# Patient Record
Sex: Male | Born: 1965 | Race: White | Hispanic: No | Marital: Single | Smoking: Current some day smoker
Health system: Southern US, Community
[De-identification: ages and names within clinical notes are randomized; demographics above are authoritative.]

## PROBLEM LIST (undated history)

## (undated) HISTORY — PX: SHOULDER SURGERY: SHX246

## (undated) HISTORY — PX: ELBOW SURGERY: SHX618

## (undated) HISTORY — PX: KNEE SURGERY: SHX244

---

## 2015-03-14 ENCOUNTER — Encounter: Payer: Self-pay | Admitting: Emergency Medicine

## 2015-03-14 ENCOUNTER — Emergency Department: Payer: BLUE CROSS/BLUE SHIELD

## 2015-03-14 ENCOUNTER — Emergency Department
Admission: EM | Admit: 2015-03-14 | Discharge: 2015-03-14 | Disposition: A | Discharge status: Home / Self Care | Payer: BLUE CROSS/BLUE SHIELD | Attending: Emergency Medicine | Admitting: Emergency Medicine

## 2015-03-14 DIAGNOSIS — G510 Bell's palsy: Secondary | ICD-10-CM

## 2015-03-14 DIAGNOSIS — R51 Headache: Secondary | ICD-10-CM | POA: Diagnosis present

## 2015-03-14 DIAGNOSIS — F17200 Nicotine dependence, unspecified, uncomplicated: Secondary | ICD-10-CM | POA: Insufficient documentation

## 2015-03-14 MED ORDER — PREDNISONE 10 MG (21) PO TBPK: 10.0000 mg | ORAL_TABLET | Freq: Every day | ORAL | Status: AC

## 2015-03-14 MED ORDER — PREDNISONE 20 MG PO TABS
60.0000 mg | ORAL_TABLET | Freq: Once | ORAL | Status: AC
  Administered 2015-03-14: 60 mg via ORAL
  Filled 2015-03-14: qty 3

## 2015-03-14 MED ORDER — VALACYCLOVIR HCL 1 G PO TABS: 2000.0000 mg | ORAL_TABLET | Freq: Three times a day (TID) | ORAL | Status: AC

## 2015-03-14 MED ORDER — VALACYCLOVIR HCL 500 MG PO TABS
1000.0000 mg | ORAL_TABLET | Freq: Once | ORAL | Status: AC
  Administered 2015-03-14: 1000 mg via ORAL
  Filled 2015-03-14: qty 2

## 2015-03-14 NOTE — ED Provider Notes (Signed)
[facility] Emergency Department Provider Note     Time seen: ----------------------------------------- 10:28 AM on 03/14/2015 -----------------------------------------    I have reviewed the triage vital signs and the nursing notes.   HISTORY  Chief Complaint Facial Droop    HPI Alfred Smith is a 50 y.o. male brought to the ER for numbness and pain to the left side of his face and neck with facial droop for the last 3 days. Patient states he hasn't been able to close his eye in 3 days. Patient denies any fevers, chills, chest pain, shortness of breath, nausea vomiting or diarrhea. She is also complaining of severe pain in the left ear canal. He has no medical problems.   History reviewed. No pertinent past medical history.  There are no active problems to display for this patient.   Past Surgical History  Procedure Laterality Date  . Knee surgery    . Shoulder surgery    . Elbow surgery      Allergies Sulfa antibiotics  Social History Social History  Substance Use Topics  . Smoking status: Current Some Day Smoker  . Smokeless tobacco: None  . Alcohol Use: Yes     Comment: occas.     Review of Systems Constitutional: Negative for fever. Eyes: Negative for visual changes. ENT: Negative for sore throat. Cardiovascular: Negative for chest pain. Respiratory: Negative for shortness of breath. Gastrointestinal: Negative for abdominal pain, vomiting and diarrhea. Genitourinary: Negative for dysuria. Musculoskeletal: Negative for back pain. Skin: Negative for rash. Neurological: Positive for left facial droop and decreased sensation  10-point ROS otherwise negative.  ____________________________________________   PHYSICAL EXAM:  VITAL SIGNS: ED Triage Vitals  Enc Vitals Group     BP 03/14/15 0954 127/65 mmHg     Pulse Rate 03/14/15 0950 68     Resp 03/14/15 0950 16     Temp 03/14/15 0950 98.2 F (36.8 C)     Temp  Source 03/14/15 0950 Oral     SpO2 03/14/15 0950 97 %     Weight 03/14/15 0950 170 lb (77.111 kg)     Height 03/14/15 0950  (1.727 m)     Head Cir --      Peak Flow --      Pain Score 03/14/15 0954 6     Pain Loc --      Pain Edu? --      Excl. in GC? --     Constitutional: Alert and oriented. Well appearing and in no distress. Eyes: Conjunctival injection the left eye. PERRL. Normal extraocular movements. ENT   Head: Normocephalic and atraumatic.   Nose: No congestion/rhinnorhea.   Mouth/Throat: Mucous membranes are moist.   Neck: No stridor. Cardiovascular: Normal rate, regular rhythm. Normal and symmetric distal pulses are present in all extremities. No murmurs, rubs, or gallops. Respiratory: Normal respiratory effort without tachypnea nor retractions. Breath sounds are clear and equal bilaterally. No wheezes/rales/rhonchi. Gastrointestinal: Soft and nontender. No distention. No abdominal bruits.  Musculoskeletal: Nontender with normal range of motion in all extremities. No joint effusions.  No lower extremity tenderness nor edema. Neurologic:  Normal speech and language. Left-sided facial paralysis, forehead is involved. Decreased sensation left side of the face, tongue treats midline. Skin:  Skin is warm, dry and intact. No rash noted. Psychiatric: Mood and affect are normal. Speech and behavior are normal. Patient exhibits appropriate insight and judgment. ____________________________________________  ED COURSE:  Pertinent labs & imaging results that were available during my care  of the patient were reviewed by me and considered in my medical decision making (see chart for details). Patient clinically with Bell's palsy, we'll obtain CT imaging to ensure diagnosis. ____________________________________________  RADIOLOGY  CT head is unremarkable  ____________________________________________  FINAL ASSESSMENT AND PLAN  Bell's palsy  Plan: Patient with  labs and imaging as dictated above. Patient will continue on prednisone and valacyclovir for the next week. He'll be referred to his primary care doctor follow-up.   Emily Filbert, MD   Emily Filbert, MD 03/14/15 [id number]

## 2015-03-14 NOTE — ED Notes (Signed)
MD at bedside. 

## 2015-03-14 NOTE — ED Notes (Signed)
Pt states numbness and pain on the left side of his face/neck for 3 days now. Left facial droop noted, states he hasn't been able to close his left eye in 3 days.  Grips equal, speech clear.

## 2015-03-14 NOTE — Discharge Instructions (Signed)
Bell Palsy °Bell palsy is a condition in which the muscles on one side of the face become paralyzed. This often causes one side of the face to droop. It is a common condition and most people recover completely. °RISK FACTORS °Risk factors for Bell palsy include: °· Pregnancy. °· Diabetes. °· An infection by a virus, such as infections that cause cold sores. °CAUSES  °Bell palsy is caused by damage to or inflammation of a nerve in your face. It is unclear why this happens, but an infection by a virus may lead to it. Most of the time the reason it happens is unknown. °SIGNS AND SYMPTOMS  °Symptoms can range from mild to severe and can take place over a number of hours. Symptoms may include: °· Being unable to: °¨ Raise one or both eyebrows. °¨ Close one or both eyes. °¨ Feel parts of your face (facial numbness). °· Drooping of the eyelid and corner of the mouth. °· Weakness in the face. °· Paralysis of half your face. °· Loss of taste. °· Sensitivity to loud noises. °· Difficulty chewing. °· Tearing up of the affected eye. °· Dryness in the affected eye. °· Drooling. °· Pain behind one ear. °DIAGNOSIS  °Diagnosis of Bell palsy may include: °· A medical history and physical exam. °· An MRI. °· A CT scan. °· Electromyography (EMG). This is a test that checks how your nerves are working. °TREATMENT  °Treatment may include antiviral medicine to help shorten the length of the condition. Sometimes treatment is not needed and the symptoms go away on their own. °HOME CARE INSTRUCTIONS  °· Take medicines only as directed by your health care provider. °· Do facial massages and exercises as directed by your health care provider. °· If your eye is affected: °¨ Use moisturizing eye drops to prevent drying of your eye as directed by your health care provider. °¨ Protect your eye as directed by your health care provider. °SEEK MEDICAL CARE IF: °· Your symptoms do not get better or get worse. °· You are drooling. °· Your eye is red,  irritated, or hurts. °SEEK IMMEDIATE MEDICAL CARE IF:  °· Another part of your body feels weak or numb. °· You have difficulty swallowing. °· You have a fever along with symptoms of Bell palsy. °· You develop neck pain. °MAKE SURE YOU:  °· Understand these instructions. °· Will watch your condition. °· Will get help right away if you are not doing well or get worse. °  °This information is not intended to replace advice given to you by your health care provider. Make sure you discuss any questions you have with your health care provider. °  °Document Released: 02/05/2005 Document Revised: 10/27/2014 Document Reviewed: 05/15/2013 °Elsevier Interactive Patient Education ©2016 [employer] ° °

## 2017-04-20 IMAGING — CT CT HEAD W/O CM
1 series · 16 of 30 positions shown, 20 images · non-contrast
Comparison: None.

CLINICAL DATA: Numbness and pain along the left side of face and
neck for 3 days. Left facial droop.

EXAM:
CT HEAD WITHOUT CONTRAST
TECHNIQUE: Contiguous axial images were obtained from the base of the skull
through the vertex without intravenous contrast.

[Series 2: soft tissue · axial · 0.44mm/px · z∈[-190,-56]mm · 16 of 31 slices shown, 20 images]
[im 2/31  brain]
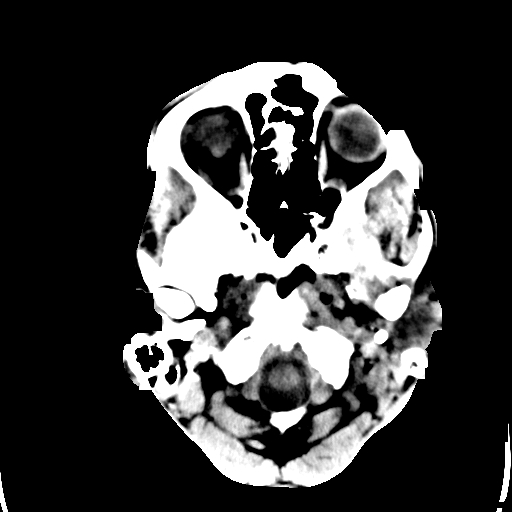
[im 2/31  bone]
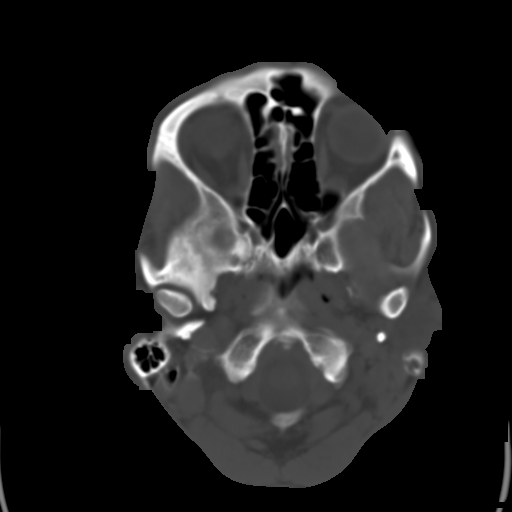
[im 4/31  brain]
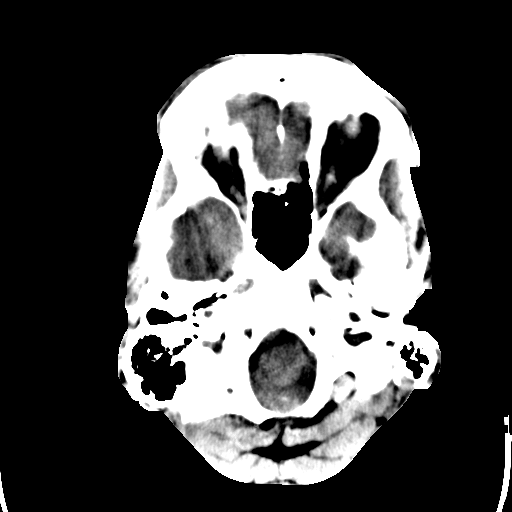
[im 6/31  brain]
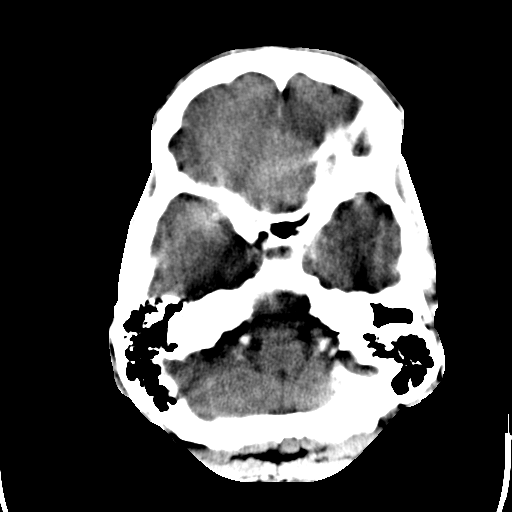
[im 8/31  brain]
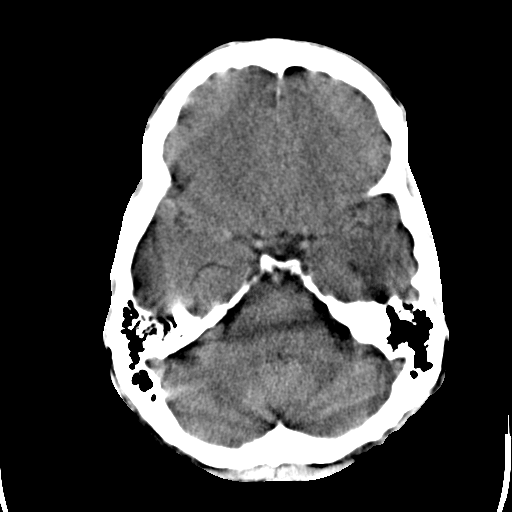
[im 9/31  brain]
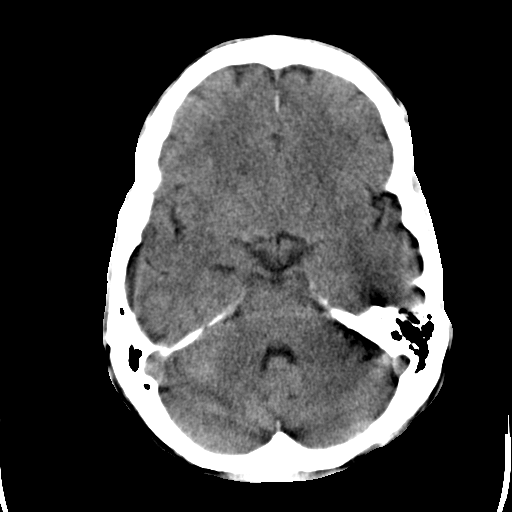
[im 9/31  bone]
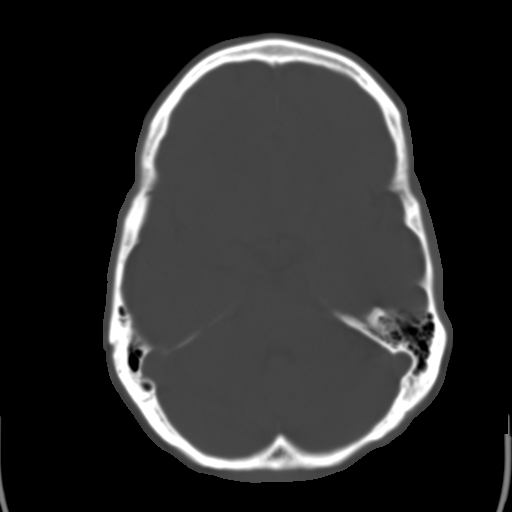
[im 11/31  brain]
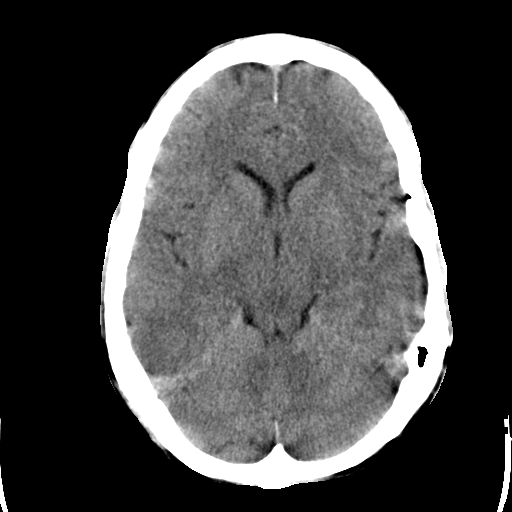
[im 13/31  brain]
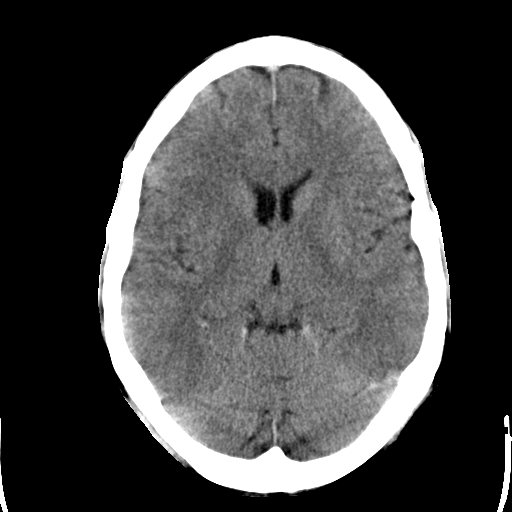
[im 15/31  brain]
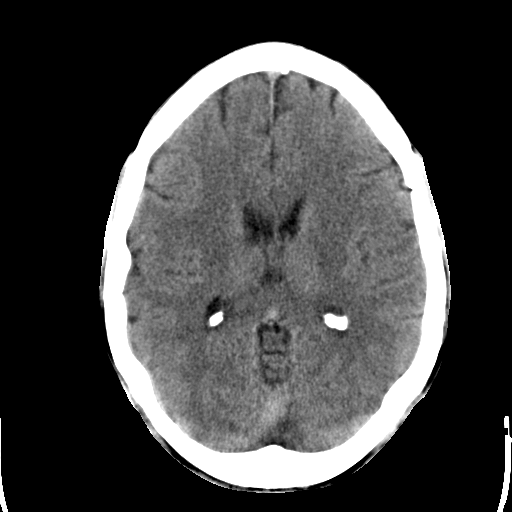
[im 16/31  brain]
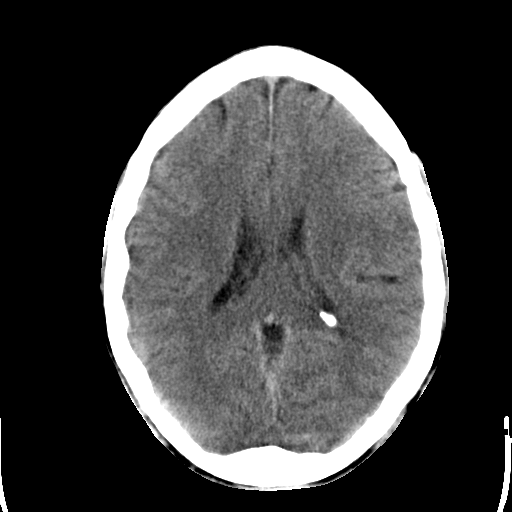
[im 16/31  bone]
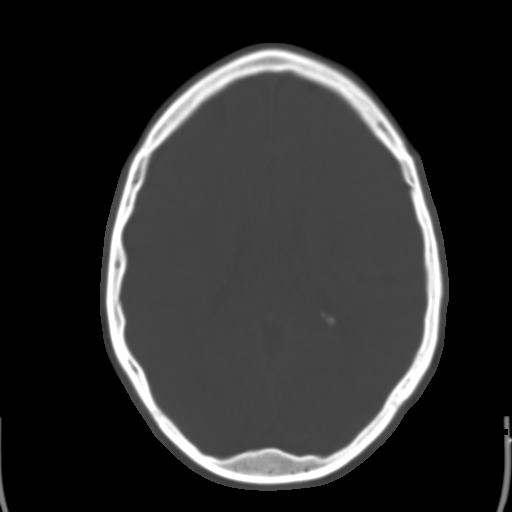
[im 18/31  brain]
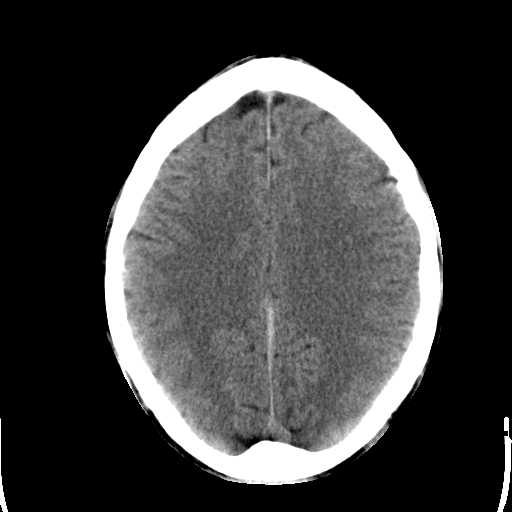
[im 20/31  brain]
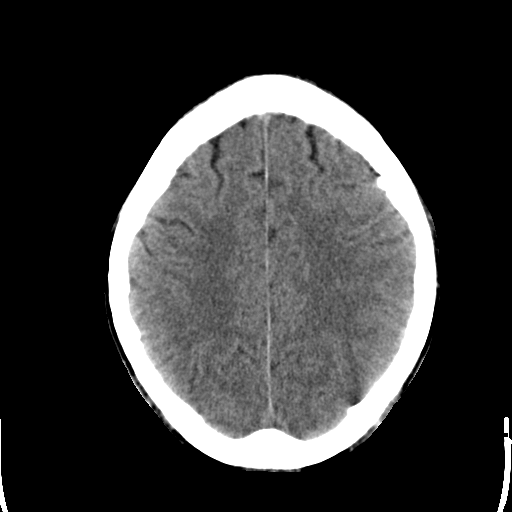
[im 22/31  brain]
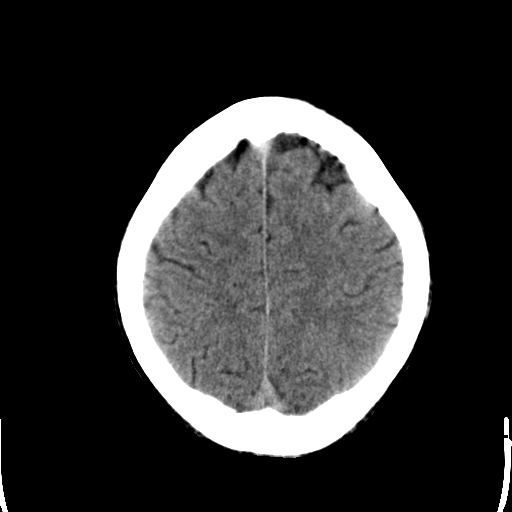
[im 23/31  brain]
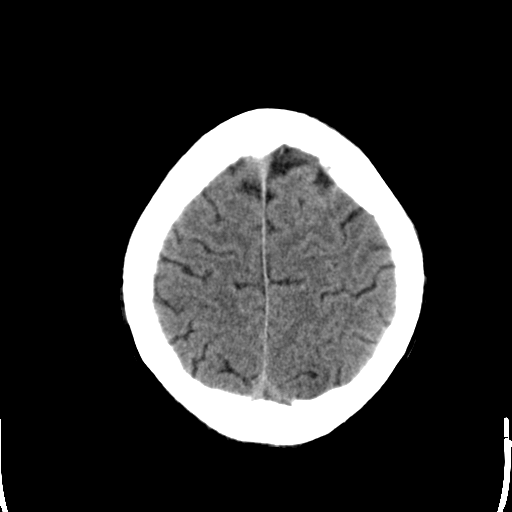
[im 23/31  bone]
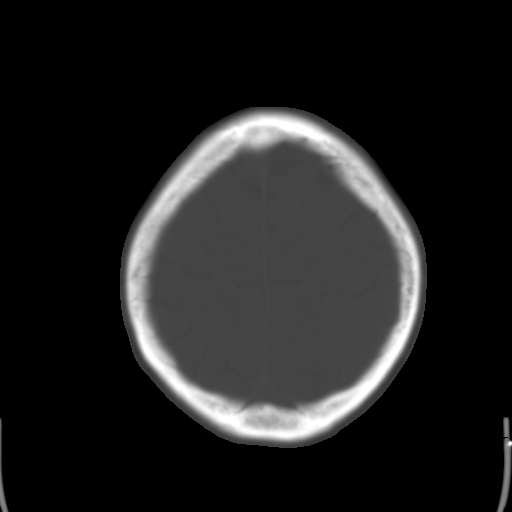
[im 25/31  brain]
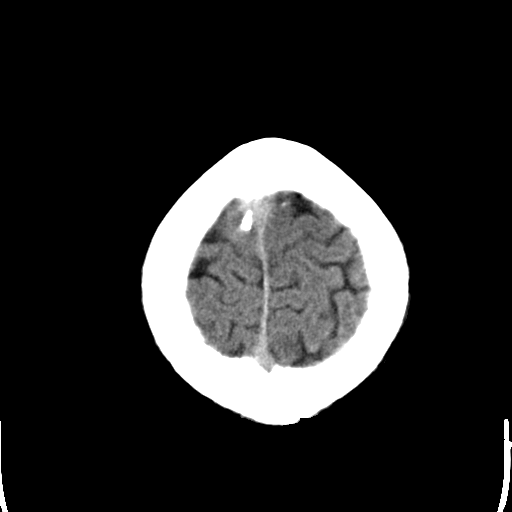
[im 27/31  brain]
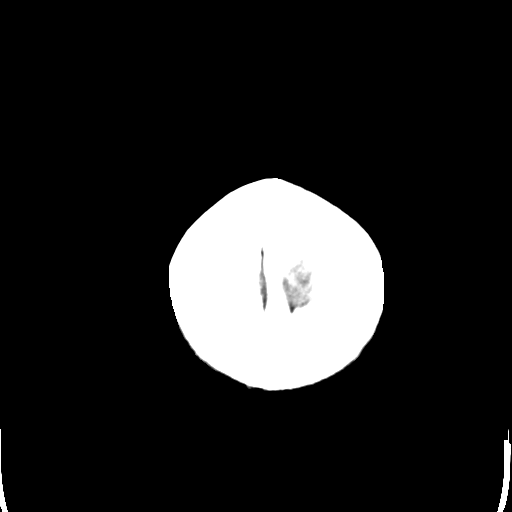
[im 29/31  brain]
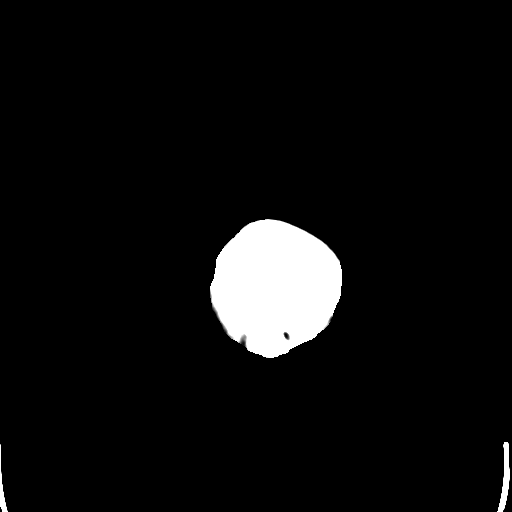

[16 of 30 positions shown; findings below may reference images not displayed]

FINDINGS: The brainstem, cerebellum, cerebral peduncles, thalami, basal
ganglia, basilar cisterns, and ventricular system appear within
normal limits. No intracranial hemorrhage, mass lesion, or acute
CVA.
IMPRESSION: 1.  No significant abnormality identified.

## 2018-05-05 ENCOUNTER — Ambulatory Visit: Payer: Self-pay | Admitting: *Deleted
